# Patient Record
Sex: Male | Born: 1995
Health system: Southern US, Community
[De-identification: ages and names within clinical notes are randomized; demographics above are authoritative.]

## PROBLEM LIST (undated history)

## (undated) DIAGNOSIS — J45909 Unspecified asthma, uncomplicated: Secondary | ICD-10-CM

---
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
## 2015-07-04 ENCOUNTER — Encounter (HOSPITAL_BASED_OUTPATIENT_CLINIC_OR_DEPARTMENT_OTHER): Payer: Self-pay | Admitting: *Deleted

## 2015-07-04 ENCOUNTER — Emergency Department (HOSPITAL_BASED_OUTPATIENT_CLINIC_OR_DEPARTMENT_OTHER)
Admission: EM | Admit: 2015-07-04 | Discharge: 2015-07-04 | Disposition: A | Discharge status: Home / Self Care | Payer: No Typology Code available for payment source | Attending: Emergency Medicine | Admitting: Emergency Medicine

## 2015-07-04 DIAGNOSIS — F17210 Nicotine dependence, cigarettes, uncomplicated: Secondary | ICD-10-CM | POA: Insufficient documentation

## 2015-07-04 DIAGNOSIS — B9789 Other viral agents as the cause of diseases classified elsewhere: Secondary | ICD-10-CM

## 2015-07-04 DIAGNOSIS — J45909 Unspecified asthma, uncomplicated: Secondary | ICD-10-CM | POA: Insufficient documentation

## 2015-07-04 DIAGNOSIS — J069 Acute upper respiratory infection, unspecified: Secondary | ICD-10-CM | POA: Insufficient documentation

## 2015-07-04 DIAGNOSIS — M791 Myalgia: Secondary | ICD-10-CM | POA: Insufficient documentation

## 2015-07-04 MED ORDER — ACETAMINOPHEN 325 MG PO TABS
650.0000 mg | ORAL_TABLET | Freq: Once | ORAL | Status: AC | PRN
  Administered 2015-07-04: 650 mg via ORAL
  Filled 2015-07-04: qty 2

## 2015-07-04 MED ORDER — IBUPROFEN 800 MG PO TABS
800.0000 mg | ORAL_TABLET | Freq: Once | ORAL | Status: AC
  Administered 2015-07-04: 800 mg via ORAL
  Filled 2015-07-04: qty 1

## 2015-07-04 NOTE — Discharge Instructions (Signed)
Upper Respiratory Infection, Adult Most upper respiratory infections (URIs) are a viral infection of the air passages leading to the lungs. A URI affects the nose, throat, and upper air passages. The most common type of URI is nasopharyngitis and is typically referred to as "the common cold." URIs run their course and usually go away on their own. Most of the time, a URI does not require medical attention, but sometimes a bacterial infection in the upper airways can follow a viral infection. This is called a secondary infection. Sinus and middle ear infections are common types of secondary upper respiratory infections. Bacterial pneumonia can also complicate a URI. A URI can worsen asthma and chronic obstructive pulmonary disease (COPD). Sometimes, these complications can require emergency medical care and may be life threatening.  CAUSES Almost all URIs are caused by viruses. A virus is a type of germ and can spread from one person to another.  RISKS FACTORS You may be at risk for a URI if:   You smoke.   You have chronic heart or lung disease.  You have a weakened defense (immune) system.   You are very young or very old.   You have nasal allergies or asthma.  You work in crowded or poorly ventilated areas.  You work in health care facilities or schools. SIGNS AND SYMPTOMS  Symptoms typically develop 2-3 days after you come in contact with a cold virus. Most viral URIs last 7-10 days. However, viral URIs from the influenza virus (flu virus) can last 14-18 days and are typically more severe. Symptoms may include:   Runny or stuffy (congested) nose.   Sneezing.   Cough.   Sore throat.   Headache.   Fatigue.   Fever.   Loss of appetite.   Pain in your forehead, behind your eyes, and over your cheekbones (sinus pain).  Muscle aches.  DIAGNOSIS  Your health care provider may diagnose a URI by:  Physical exam.  Tests to check that your symptoms are not due to  another condition such as:  Strep throat.  Sinusitis.  Pneumonia.  Asthma. TREATMENT  A URI goes away on its own with time. It cannot be cured with medicines, but medicines may be prescribed or recommended to relieve symptoms. Medicines may help:  Reduce your fever.  Reduce your cough.  Relieve nasal congestion. HOME CARE INSTRUCTIONS   Take medicines only as directed by your health care provider.   Gargle warm saltwater or take cough drops to comfort your throat as directed by your health care provider.  Use a warm mist humidifier or inhale steam from a shower to increase air moisture. This may make it easier to breathe.  Drink enough fluid to keep your urine clear or pale yellow.   Eat soups and other clear broths and maintain good nutrition.   Rest as needed.   Return to work when your temperature has returned to normal or as your health care provider advises. You may need to stay home longer to avoid infecting others. You can also use a face mask and careful hand washing to prevent spread of the virus.  Increase the usage of your inhaler if you have asthma.   Do not use any tobacco products, including cigarettes, chewing tobacco, or electronic cigarettes. If you need help quitting, ask your health care provider. PREVENTION  The best way to protect yourself from getting a cold is to practice good hygiene.   Avoid oral or hand contact with people with cold   symptoms.   Wash your hands often if contact occurs.  There is no clear evidence that vitamin C, vitamin E, echinacea, or exercise reduces the chance of developing a cold. However, it is always recommended to get plenty of rest, exercise, and practice good nutrition.  SEEK MEDICAL CARE IF:   You are getting worse rather than better.   Your symptoms are not controlled by medicine.   You have chills.  You have worsening shortness of breath.  You have brown or red mucus.  You have yellow or brown nasal  discharge.  You have pain in your face, especially when you bend forward.  You have a fever.  You have swollen neck glands.  You have pain while swallowing.  You have white areas in the back of your throat. SEEK IMMEDIATE MEDICAL CARE IF:   You have severe or persistent:  Headache.  Ear pain.  Sinus pain.  Chest pain.  You have chronic lung disease and any of the following:  Wheezing.  Prolonged cough.  Coughing up blood.  A change in your usual mucus.  You have a stiff neck.  You have changes in your:  Vision.  Hearing.  Thinking.  Mood. MAKE SURE YOU:   Understand these instructions.  Will watch your condition.  Will get help right away if you are not doing well or get worse.   This information is not intended to replace advice given to you by your health care provider. Make sure you discuss any questions you have with your health care provider.   Document Released: 10/25/2000 Document Revised: 09/15/2014 Document Reviewed: 08/06/2013 Elsevier Interactive Patient Education 2016 [employer]  

## 2015-07-04 NOTE — ED Provider Notes (Signed)
CSN: [id number]     Arrival date & time 07/04/15  0865 History   First MD Initiated Contact with Patient 07/04/15 [phone]     Chief Complaint  Patient presents with  . URI     (Consider location/radiation/quality/duration/timing/severity/associated sxs/prior Treatment) Patient is a 20 y.o. male presenting with URI. The history is provided by the patient.  URI Presenting symptoms: cough and fever   Severity:  Moderate Onset quality:  Gradual Duration:  2 days Timing:  Constant Progression:  Unchanged Chronicity:  New Relieved by:  Nothing Worsened by:  Nothing tried Ineffective treatments: theraflu. Associated symptoms: myalgias     Past Medical History  Diagnosis Date  . Asthma    History reviewed. No pertinent past surgical history. No family history on file. Social History  Substance Use Topics  . Smoking status: Current Every Day Smoker -- 0.50 packs/day    Types: Cigarettes  . Smokeless tobacco: None  . Alcohol Use: Yes    Review of Systems  Constitutional: Positive for fever.  Respiratory: Positive for cough.   Musculoskeletal: Positive for myalgias.  All other systems reviewed and are negative.     Allergies  Review of patient's allergies indicates no known allergies.  Home Medications   Prior to Admission medications   Not on File   BP 132/76 mmHg  Pulse 78  Temp(Src) 101.3 F (38.5 C) (Oral)  Resp 18  Ht  (1.905 m)  Wt 195 lb (88.451 kg)  BMI 24.37 kg/m2  SpO2 98% Physical Exam  Constitutional: He is oriented to person, place, and time. He appears well-developed and well-nourished. No distress.  HENT:  Head: Normocephalic and atraumatic.  Eyes: Conjunctivae are normal.  Neck: Neck supple. No tracheal deviation present.  Cardiovascular: Normal rate, regular rhythm and normal heart sounds.   Pulmonary/Chest: Effort normal and breath sounds normal. No respiratory distress. He has no wheezes. He has no rales.  Abdominal: Soft. He exhibits  no distension.  Neurological: He is alert and oriented to person, place, and time.  Skin: Skin is warm and dry.  Psychiatric: He has a normal mood and affect.  Nursing note and vitals reviewed.   ED Course  Procedures (including critical care time) Labs Review Labs Reviewed - No data to display  Imaging Review No results found. I have personally reviewed and evaluated these images and lab results as part of my medical decision-making.   EKG Interpretation None      MDM   Final diagnoses:  Viral URI with cough    20 y.o. male presents with cough and fever with chills for 2 days. Asking for work note. No signs of respiratory distress, non-toxic appearing, CTAB, no concern for pneumonia with this clinical picture. No emergent testing indicated at this time. Pt discharged with likely viral cough which will be self limited in its course. Advised on optimal use of motrin and tylenol for fever or symptomatic control. Patient needs to establish primary care in the area and was provided contact information to do so.     Lyndal Pulley, MD 07/04/15 1125

## 2015-07-04 NOTE — ED Notes (Signed)
Patient c/o fever;/cough/nausea/body aches/ha

## 2015-07-04 NOTE — ED Notes (Signed)
Patent not being discharged at this time

## 2015-12-27 ENCOUNTER — Emergency Department (HOSPITAL_BASED_OUTPATIENT_CLINIC_OR_DEPARTMENT_OTHER): Payer: Self-pay

## 2015-12-27 ENCOUNTER — Encounter (HOSPITAL_BASED_OUTPATIENT_CLINIC_OR_DEPARTMENT_OTHER): Payer: Self-pay | Admitting: *Deleted

## 2015-12-27 ENCOUNTER — Emergency Department (HOSPITAL_BASED_OUTPATIENT_CLINIC_OR_DEPARTMENT_OTHER)
Admission: EM | Admit: 2015-12-27 | Discharge: 2015-12-27 | Disposition: A | Discharge status: Home / Self Care | Payer: Self-pay | Attending: Emergency Medicine | Admitting: Emergency Medicine

## 2015-12-27 DIAGNOSIS — J45909 Unspecified asthma, uncomplicated: Secondary | ICD-10-CM | POA: Insufficient documentation

## 2015-12-27 DIAGNOSIS — N50819 Testicular pain, unspecified: Secondary | ICD-10-CM

## 2015-12-27 DIAGNOSIS — F17210 Nicotine dependence, cigarettes, uncomplicated: Secondary | ICD-10-CM | POA: Insufficient documentation

## 2015-12-27 DIAGNOSIS — N451 Epididymitis: Secondary | ICD-10-CM | POA: Insufficient documentation

## 2015-12-27 DIAGNOSIS — N433 Hydrocele, unspecified: Secondary | ICD-10-CM | POA: Insufficient documentation

## 2015-12-27 LAB — URINE MICROSCOPIC-ADD ON

## 2015-12-27 LAB — URINALYSIS, ROUTINE W REFLEX MICROSCOPIC
Bilirubin Urine: NEGATIVE
GLUCOSE, UA: NEGATIVE mg/dL
Hgb urine dipstick: NEGATIVE
Ketones, ur: NEGATIVE mg/dL
Nitrite: NEGATIVE
PH: 5.5 (ref 5.0–8.0)
Protein, ur: NEGATIVE mg/dL
SPECIFIC GRAVITY, URINE: 1.01 (ref 1.005–1.030)

## 2015-12-27 MED ORDER — LIDOCAINE HCL (PF) 1 % IJ SOLN
INTRAMUSCULAR | Status: AC
  Administered 2015-12-27: 0.9 mL
  Filled 2015-12-27: qty 5

## 2015-12-27 MED ORDER — DOXYCYCLINE HYCLATE 100 MG PO CAPS: 100.0000 mg | ORAL_CAPSULE | Freq: Two times a day (BID) | ORAL | 0 refills | Status: DC

## 2015-12-27 MED ORDER — DOXYCYCLINE HYCLATE 100 MG PO TABS
100.0000 mg | ORAL_TABLET | Freq: Once | ORAL | Status: AC
  Administered 2015-12-27: 100 mg via ORAL
  Filled 2015-12-27: qty 1

## 2015-12-27 MED ORDER — CEFTRIAXONE SODIUM 250 MG IJ SOLR
250.0000 mg | Freq: Once | INTRAMUSCULAR | Status: AC
  Administered 2015-12-27: 250 mg via INTRAMUSCULAR
  Filled 2015-12-27: qty 250

## 2015-12-27 MED FILL — DOXYCYCLINE HYC 100 MG CAP: 100 | 10 days supply | Qty: 20 | Fill #0

## 2015-12-27 NOTE — ED Provider Notes (Signed)
[facility]   CSN: [id number] Arrival date & time: 12/27/15  1222     History   Chief Complaint Chief Complaint  Patient presents with  . Testicle Pain    HPI Ryan Armstrong is a 20 y.o. male.  HPI Ryan Armstrong is a 20 y.o. male presents to ED with complaint of right testicle pain and swelling onset 2 days ago. Denies trauma. States pain with movement and palpation. Denies penile discharge. No rashes. No fever or chills. No other complaints. No hx of the same.   Past Medical History:  Diagnosis Date  . Asthma     There are no active problems to display for this patient.   History reviewed. No pertinent surgical history.     Home Medications    Prior to Admission medications   Not on File    Family History No family history on file.  Social History Social History  Substance Use Topics  . Smoking status: Current Every Day Smoker    Packs/day: 0.50    Types: Cigarettes  . Smokeless tobacco: Never Used  . Alcohol use Yes     Allergies   Review of patient's allergies indicates no known allergies.   Review of Systems Review of Systems  Constitutional: Negative for chills and fever.  Respiratory: Negative for cough, chest tightness and shortness of breath.   Cardiovascular: Negative for chest pain, palpitations and leg swelling.  Gastrointestinal: Negative for abdominal distention, abdominal pain, diarrhea, nausea and vomiting.  Genitourinary: Positive for scrotal swelling and testicular pain. Negative for discharge, dysuria, frequency, hematuria, penile pain, penile swelling and urgency.  Skin: Negative for rash.  Allergic/Immunologic: Negative for immunocompromised state.  Neurological: Negative for dizziness, weakness, light-headedness, numbness and headaches.  All other systems reviewed and are negative.    Physical Exam Updated Vital Signs BP 127/75 (BP Location: Right Arm)   Pulse (!) 51   Temp 98.2 F (36.8 C)  (Oral)   Resp 18   Ht 6\' 3"  (1.905 m)   Wt 88.5 kg   SpO2 100%   BMI 24.37 kg/m   Physical Exam  Constitutional: He appears well-developed and well-nourished. No distress.  HENT:  Head: Normocephalic and atraumatic.  Eyes: Conjunctivae are normal.  Neck: Neck supple.  Cardiovascular: Normal rate, regular rhythm and normal heart sounds.   Pulmonary/Chest: Effort normal. No respiratory distress. He has no wheezes. He has no rales.  Abdominal: Soft. Bowel sounds are normal. He exhibits no distension. There is no tenderness. There is no rebound.  Genitourinary:  Genitourinary Comments: Normal left testicle. Right testicle is enlarged and tender. Normal cremasteric reflex. Normal penis.  Musculoskeletal: He exhibits no edema.  Neurological: He is alert.  Skin: Skin is warm and dry.  Nursing note and vitals reviewed.    ED Treatments / Results  Labs (all labs ordered are listed, but only abnormal results are displayed) Labs Reviewed  URINALYSIS, ROUTINE W REFLEX MICROSCOPIC (NOT AT [facility]) - Abnormal; Notable for the following:       Result Value   Leukocytes, UA MODERATE (*)    All other components within normal limits  URINE MICROSCOPIC-ADD ON - Abnormal; Notable for the following:    Squamous Epithelial / LPF 0-5 (*)    Bacteria, UA RARE (*)    All other components within normal limits  URINE CULTURE  GC/CHLAMYDIA PROBE AMP ([place]) NOT AT [facility]    EKG  EKG Interpretation None  Radiology [place] Scrotum  Result Date: 12/27/2015 CLINICAL DATA:  Right scrotal swelling and pain for 2 days. EXAM: SCROTAL ULTRASOUND DOPPLER ULTRASOUND OF THE TESTICLES TECHNIQUE: Complete ultrasound examination of the testicles, epididymis, and other scrotal structures was performed. Color and spectral Doppler ultrasound were also utilized to evaluate blood flow to the testicles. COMPARISON:  None. FINDINGS: Right testicle Measurements: 4.0 x 2.3 x 3.3 cm. No mass or microlithiasis  visualized. Left testicle Measurements: 4.4 x 2.5 x 2.9 cm. No mass or microlithiasis visualized. Right epididymis: Swelling and and increased blood flow on color Doppler ultrasound is seen involving the tail of the right epididymis along the inferior aspect of the right testicle. This is consistent with epididymitis. Left epididymis:  Normal in size and appearance. Hydrocele:  Small right-sided hydrocele noted. Varicocele: Small to moderate varicoceles are seen bilaterally, right side larger than left. Pulsed Doppler interrogation of both testes demonstrates normal low resistance arterial and venous waveforms bilaterally. IMPRESSION: No evidence of testicular mass or torsion. Right-sided epididymitis involving the epididymal tail. Small right hydrocele also noted. Small to moderate bilateral varicoceles. Electronically Signed   By: Myles RosenthalJohn  Stahl M.D.   On: 12/27/2015 14:44   [place] Art/ven Flow Abd Pelv Doppler  Result Date: 12/27/2015 CLINICAL DATA:  Right scrotal swelling and pain for 2 days. EXAM: SCROTAL ULTRASOUND DOPPLER ULTRASOUND OF THE TESTICLES TECHNIQUE: Complete ultrasound examination of the testicles, epididymis, and other scrotal structures was performed. Color and spectral Doppler ultrasound were also utilized to evaluate blood flow to the testicles. COMPARISON:  None. FINDINGS: Right testicle Measurements: 4.0 x 2.3 x 3.3 cm. No mass or microlithiasis visualized. Left testicle Measurements: 4.4 x 2.5 x 2.9 cm. No mass or microlithiasis visualized. Right epididymis: Swelling and and increased blood flow on color Doppler ultrasound is seen involving the tail of the right epididymis along the inferior aspect of the right testicle. This is consistent with epididymitis. Left epididymis:  Normal in size and appearance. Hydrocele:  Small right-sided hydrocele noted. Varicocele: Small to moderate varicoceles are seen bilaterally, right side larger than left. Pulsed Doppler interrogation of both testes  demonstrates normal low resistance arterial and venous waveforms bilaterally. IMPRESSION: No evidence of testicular mass or torsion. Right-sided epididymitis involving the epididymal tail. Small right hydrocele also noted. Small to moderate bilateral varicoceles. Electronically Signed   By: Myles RosenthalJohn  Stahl M.D.   On: 12/27/2015 14:44    Procedures Procedures (including critical care time)  Medications Ordered in ED Medications  cefTRIAXone (ROCEPHIN) injection 250 mg (250 mg Intramuscular Given 12/27/15 1509)  doxycycline (VIBRA-TABS) tablet 100 mg (100 mg Oral Given 12/27/15 1509)  lidocaine (PF) (XYLOCAINE) 1 % injection (0.9 mLs  Given 12/27/15 1508)     Initial Impression / Assessment and Plan / ED Course  I have reviewed the triage vital signs and the nursing notes.  Pertinent labs & imaging results that were available during my care of the patient were reviewed by me and considered in my medical decision making (see chart for details).  Clinical Course    Patient emergency department with right sided scrotal swelling and pain for 2 days. Urinalysis shows rare bacteria and 6-30 WBCs. Ultrasound obtained which showed a right-sided epididymitis and small right hydrocele. Given patient's age group, will treat with Rocephin 250 mg IM and doxycycline for 10 days. Instructed to follow-up. Return precautions discussed.    Final Clinical Impressions(s) / ED Diagnoses   Final diagnoses:  Testicle pain     New Prescriptions New Prescriptions  No medications on file     Jaynie Crumbleatyana Tyese Finken, PA-C 12/27/15 1535    Jerelyn ScottMartha Linker, MD 12/28/15 [phone]

## 2015-12-27 NOTE — Discharge Instructions (Signed)
Take antibiotics as prescribed until all gone. Keep scrotum elevated when sitting/laying down. Follow up with urology if not improving.

## 2015-12-27 NOTE — ED Triage Notes (Signed)
Knot on his left testicle. Pain 3 days. Swelling.

## 2015-12-28 LAB — URINE CULTURE: Culture: NO GROWTH

## 2015-12-28 LAB — GC/CHLAMYDIA PROBE AMP (~~LOC~~) NOT AT ARMC
CHLAMYDIA, DNA PROBE: POSITIVE — AB
Neisseria Gonorrhea: NEGATIVE

## 2015-12-29 ENCOUNTER — Telehealth (HOSPITAL_BASED_OUTPATIENT_CLINIC_OR_DEPARTMENT_OTHER): Payer: Self-pay

## 2015-12-29 ENCOUNTER — Telehealth: Payer: Self-pay | Admitting: *Deleted

## 2015-12-29 NOTE — Telephone Encounter (Signed)
Post ED Visit - Positive Culture Follow-up: Unsuccessful Patient Follow-up  Culture assessed and recommendations reviewed by: []  Enzo BiNathan Batchelder, Pharm.D. []  Celedonio MiyamotoJeremy Frens, Pharm.D., BCPS []  Garvin FilaMike Maccia, Pharm.D. []  Georgina PillionElizabeth Martin, [address]Ph[address]D., BCPS []  Country AcresMinh Pham, [place]arm[place]D., BCPS, AAHIVP []  Estella HuskMichelle Turner, Pharm.D., BCPS, AAHIVP []  Cassie Stewart, Pharm.D. []  Rob Oswaldo DoneVincent, [address]Pharm[address]  Positive chlamydia culture/treated at time of visit  []  Patient discharged without antimicrobial prescription and treatment is now indicated []  Organism is resistant to prescribed ED discharge antimicrobial []  Patient with positive blood cultures   Unable to contact patient after 3 attempts, letter will be sent to address on file  Ryan PearlRobertson, Ryan Armstrong 12/29/2015, 8:43 AM

## 2016-03-01 ENCOUNTER — Telehealth (HOSPITAL_BASED_OUTPATIENT_CLINIC_OR_DEPARTMENT_OTHER): Payer: Self-pay | Admitting: Emergency Medicine

## 2016-03-01 NOTE — Telephone Encounter (Signed)
Lost to followup 

## 2016-12-23 IMAGING — US US SCROTUM
1 series · 13 of 25 positions shown · non-contrast
Comparison: None.

CLINICAL DATA: Right scrotal swelling and pain for 2 days.

EXAM:
SCROTAL ULTRASOUND
DOPPLER ULTRASOUND OF THE TESTICLES
TECHNIQUE: Complete ultrasound examination of the testicles, epididymis, and
other scrotal structures was performed. Color and spectral Doppler
ultrasound were also utilized to evaluate blood flow to the
testicles.

[Series 1: us scrotum · 0.08mm/px · 13 of 47 slices shown]
[im 1/47]
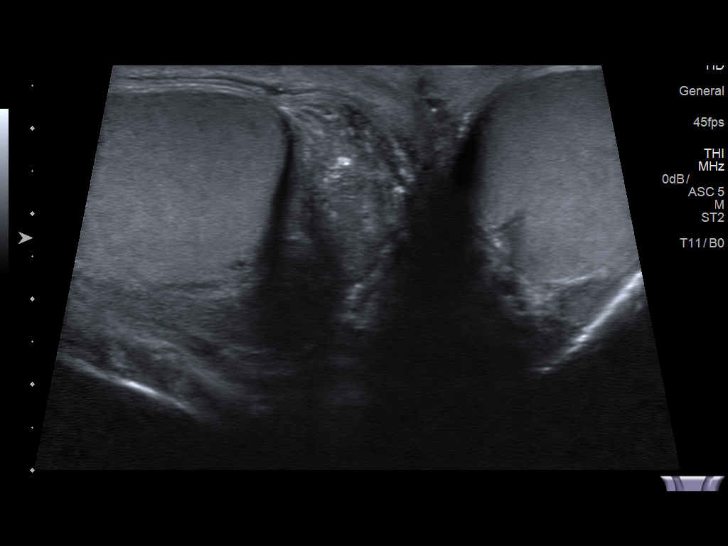
[im 4/47]
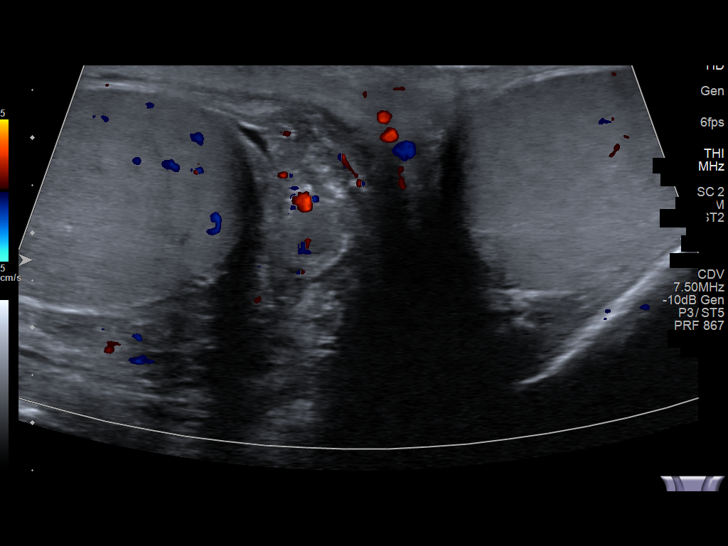
[im 8/47]
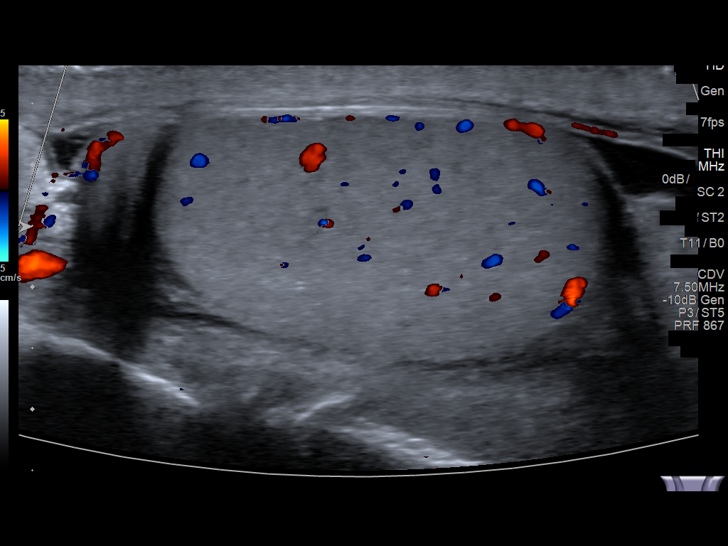
[im 12/47]
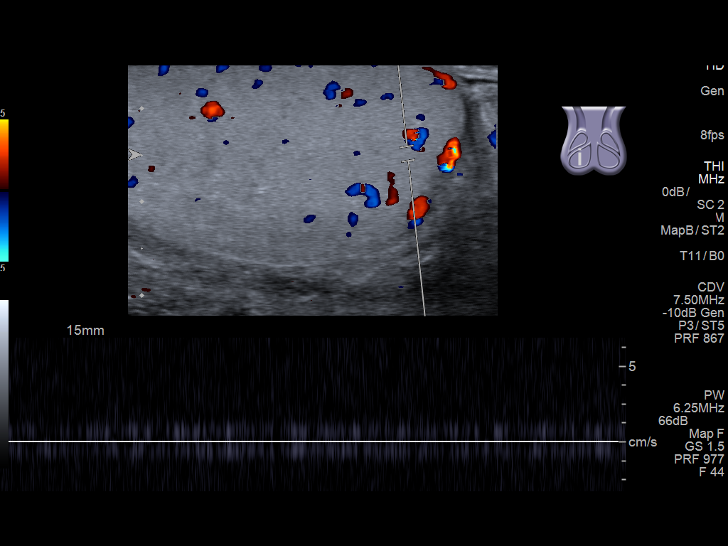
[im 16/47]
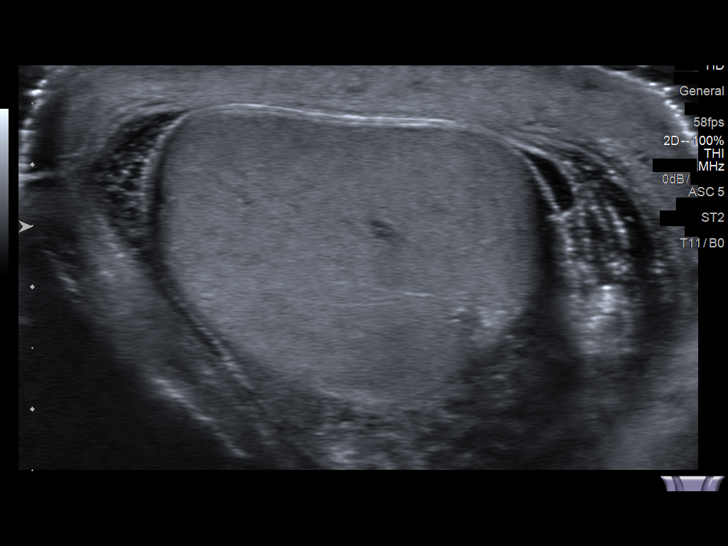
[im 20/47]
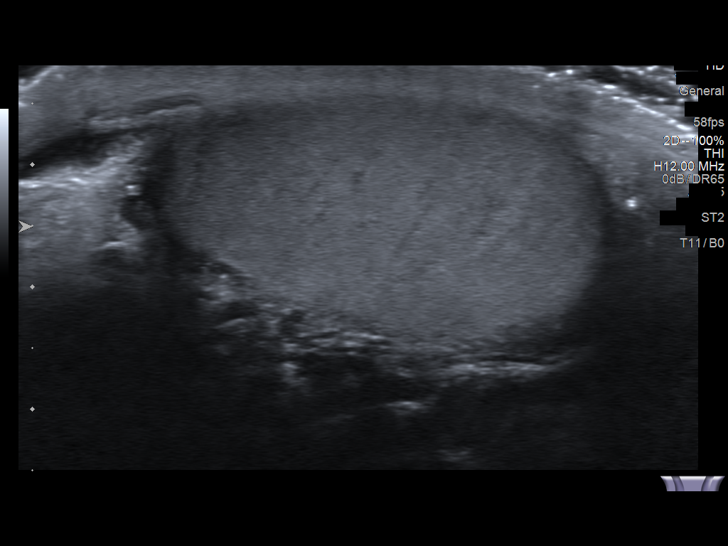
[im 24/47]
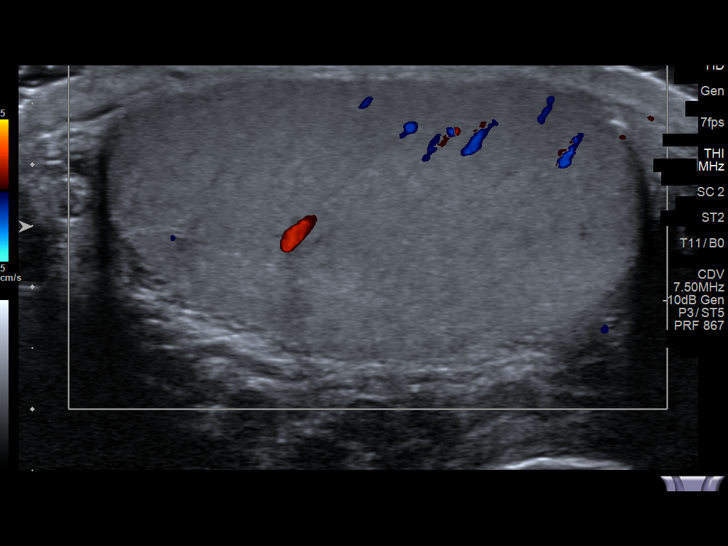
[im 27/47]
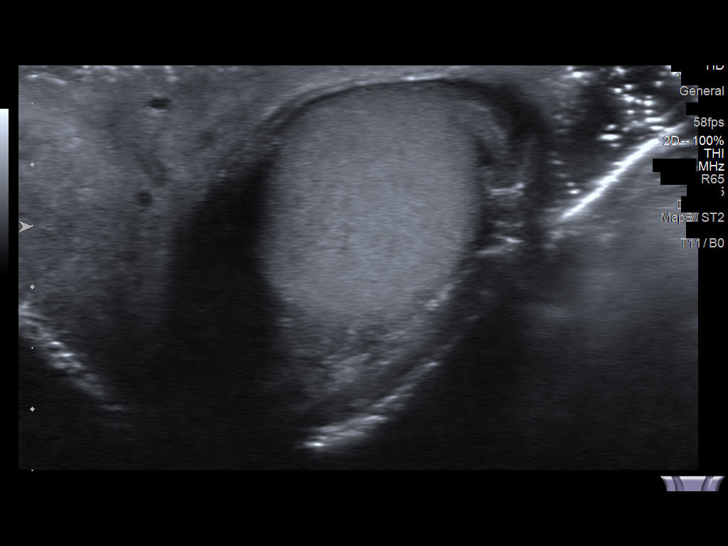
[im 31/47]
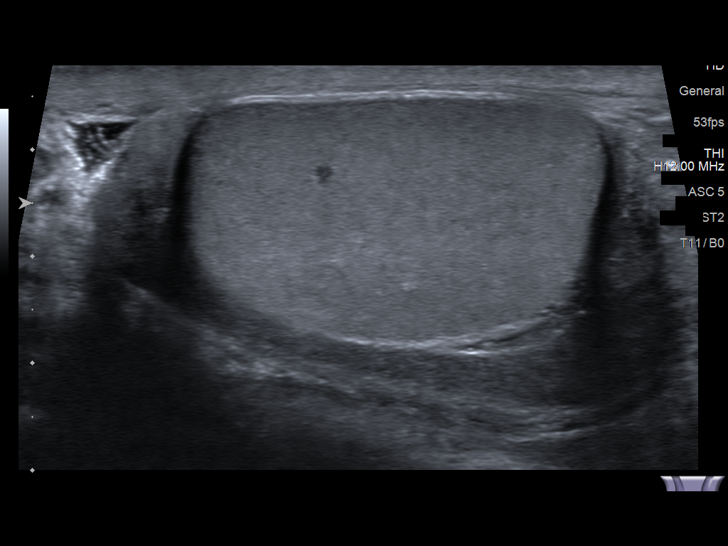
[im 35/47]
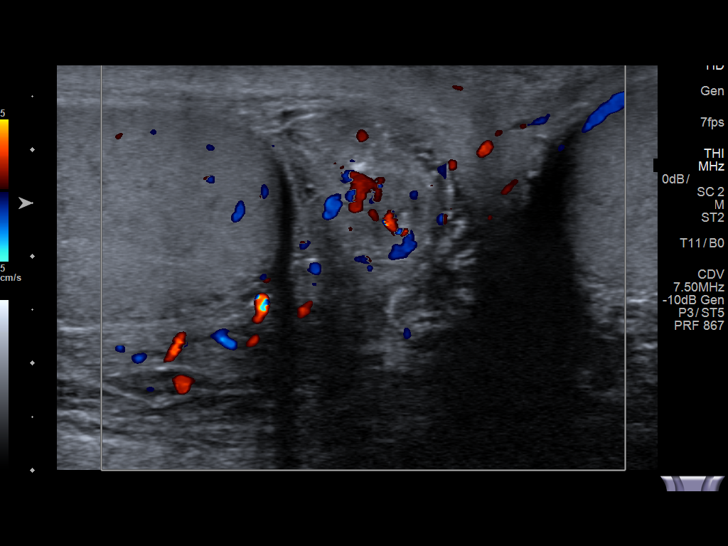
[im 39/47]
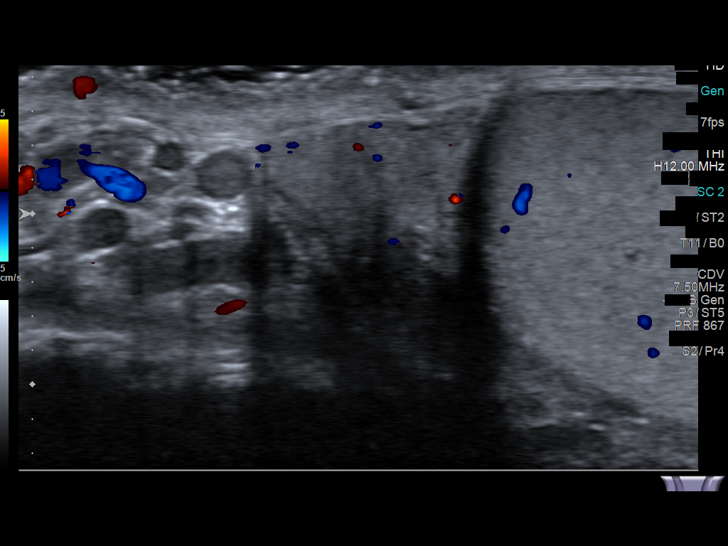
[im 43/47]
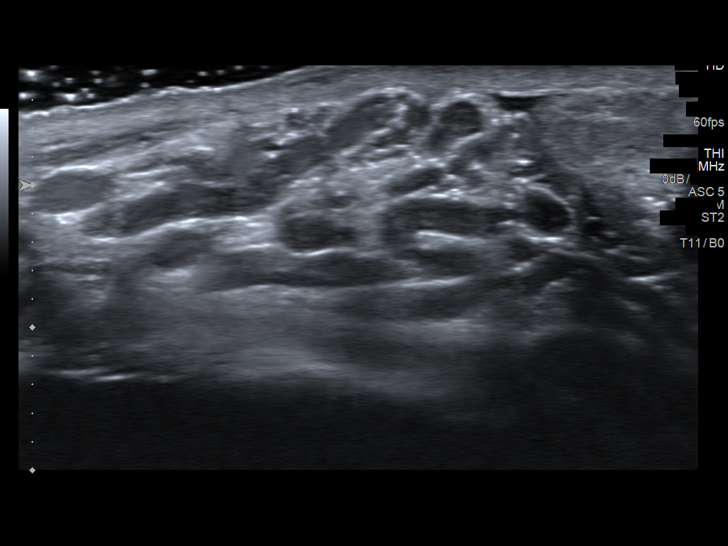
[im 47/47]
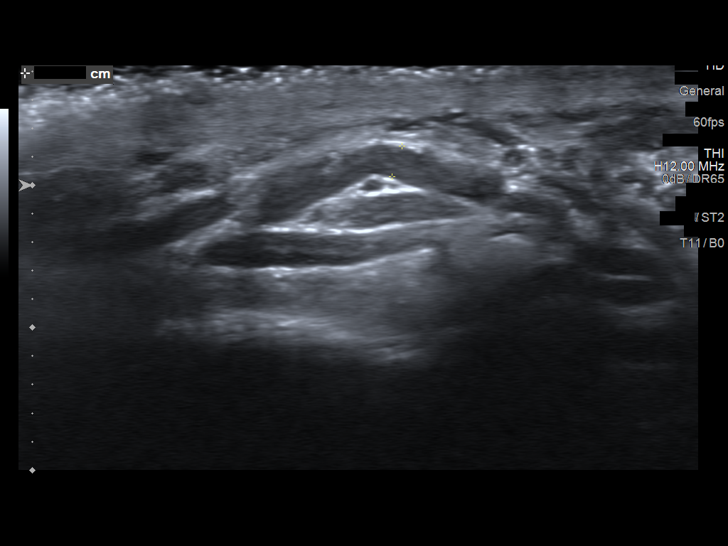

[13 of 25 positions shown; findings below may reference images not displayed]

FINDINGS: Right testicle

Measurements: 4.0 x 2.3 x 3.3 cm. No mass or microlithiasis
visualized.

Left testicle

Measurements: 4.4 x 2.5 x 2.9 cm. No mass or microlithiasis
visualized.

Right epididymis: Swelling and and increased blood flow on color
Doppler ultrasound is seen involving the tail of the right
epididymis along the inferior aspect of the right testicle. This is
consistent with epididymitis.

Left epididymis:  Normal in size and appearance.

Hydrocele:  Small right-sided hydrocele noted.

Varicocele: Small to moderate varicoceles are seen bilaterally,
right side larger than left.

Pulsed Doppler interrogation of both testes demonstrates normal low
resistance arterial and venous waveforms bilaterally.
IMPRESSION: No evidence of testicular mass or torsion.

Right-sided epididymitis involving the epididymal tail. Small right
hydrocele also noted.

Small to moderate bilateral varicoceles.

## 2017-06-15 ENCOUNTER — Encounter (HOSPITAL_BASED_OUTPATIENT_CLINIC_OR_DEPARTMENT_OTHER): Payer: Self-pay

## 2017-06-15 ENCOUNTER — Other Ambulatory Visit: Payer: Self-pay

## 2017-06-15 ENCOUNTER — Emergency Department (HOSPITAL_BASED_OUTPATIENT_CLINIC_OR_DEPARTMENT_OTHER)
Admission: EM | Admit: 2017-06-15 | Discharge: 2017-06-16 | Disposition: A | Discharge status: Home / Self Care | Payer: Self-pay | Attending: Emergency Medicine | Admitting: Emergency Medicine

## 2017-06-15 DIAGNOSIS — J45901 Unspecified asthma with (acute) exacerbation: Secondary | ICD-10-CM | POA: Insufficient documentation

## 2017-06-15 DIAGNOSIS — F17210 Nicotine dependence, cigarettes, uncomplicated: Secondary | ICD-10-CM | POA: Insufficient documentation

## 2017-06-15 DIAGNOSIS — J209 Acute bronchitis, unspecified: Secondary | ICD-10-CM | POA: Insufficient documentation

## 2017-06-15 MED ORDER — IPRATROPIUM-ALBUTEROL 0.5-2.5 (3) MG/3ML IN SOLN
3.0000 mL | Freq: Four times a day (QID) | RESPIRATORY_TRACT | Status: DC
  Administered 2017-06-15: 3 mL via RESPIRATORY_TRACT
  Filled 2017-06-15: qty 3

## 2017-06-15 MED ORDER — ALBUTEROL SULFATE (2.5 MG/3ML) 0.083% IN NEBU
2.5000 mg | INHALATION_SOLUTION | Freq: Once | RESPIRATORY_TRACT | Status: AC
  Administered 2017-06-15: 2.5 mg via RESPIRATORY_TRACT
  Filled 2017-06-15: qty 3

## 2017-06-15 NOTE — ED Triage Notes (Signed)
Pt c/o SOB and wheezing with nasal congestion for the last two weeks, hx of asthma and doesn't have an inhaler, denies any OTC meds for symptoms

## 2017-06-16 MED ORDER — ALBUTEROL SULFATE HFA 108 (90 BASE) MCG/ACT IN AERS
2.0000 | INHALATION_SPRAY | RESPIRATORY_TRACT | Status: DC | PRN
Start: 2017-06-16 — End: 2017-06-16
  Administered 2017-06-16: 2 via RESPIRATORY_TRACT
  Filled 2017-06-16: qty 6.7

## 2017-06-16 MED ORDER — DEXAMETHASONE SODIUM PHOSPHATE 10 MG/ML IJ SOLN
10.0000 mg | Freq: Once | INTRAMUSCULAR | Status: AC
  Administered 2017-06-16: 10 mg via INTRAMUSCULAR
  Filled 2017-06-16: qty 1

## 2017-06-16 NOTE — ED Notes (Signed)
C/o sob x 2 weeks  Has not had inhaler  Denies pain    Received breathing tx on arrival,  States he feels a lot better now

## 2017-06-16 NOTE — ED Provider Notes (Signed)
   [facility] Provider Note: Lowella DellJ. Lane Linetta Regner, MD, FACEP  CSN: [id number] MRN: [id number] ARRIVAL: 06/15/17 at 2322 ROOM: [facility]07/[facility]07   CHIEF COMPLAINT  Shortness of Breath   HISTORY OF PRESENT ILLNESS  06/16/17 2:02 AM Ryan Armstrong is a [age] y.o. male with a history of asthma.  He is here with a 1-2-week history of shortness of breath and wheezing.  This worsened acutely and was moderate to severe on arrival.  He does not have a home inhaler.  He was noted to be wheezing and was given albuterol and Atrovent by respiratory therapy with significant improvement.  He denies nasal congestion or rhinorrhea to me.  He has had an associated cough.  He was noted to have a low-grade fever on arrival.   Past Medical History:  Diagnosis Date  . Asthma     History reviewed. No pertinent surgical history.  No family history on file.  Social History   Tobacco Use  . Smoking status: Current Every Day Smoker    Packs/day: 0.50    Types: Cigarettes  . Smokeless tobacco: Never Used  Substance Use Topics  . Alcohol use: Yes  . Drug use: No    Prior to Admission medications   Medication Sig Start Date End Date Taking? Authorizing Provider  doxycycline (VIBRAMYCIN) 100 MG capsule Take 1 capsule (100 mg total) by mouth 2 (two) times daily. 12/27/15   Jaynie CrumbleKirichenko, Tatyana, PA-C    Allergies Patient has no known allergies.   REVIEW OF SYSTEMS  Negative except as noted here or in the History of Present Illness.   PHYSICAL EXAMINATION  Initial Vital Signs Blood pressure 118/66, pulse 84, temperature 99.2 F (37.3 C), temperature source Oral, resp. rate 18, height 6\' 3"  (1.905 m), weight 90.7 kg (200 lb), SpO2 92 %.  Examination General: Well-developed, well-nourished male in no acute distress; appearance consistent with age of record HENT: normocephalic; atraumatic; no pharyngeal erythema or exudate Eyes: pupils equal, round and reactive to light; extraocular muscles intact Neck:  supple Heart: regular rate and rhythm Lungs: clear to auscultation bilaterally Abdomen: soft; nondistended; nontender; bowel sounds present Extremities: No deformity; full range of motion; pulses normal Neurologic: Awake, alert and oriented; motor function intact in all extremities and symmetric; no facial droop Skin: Warm and dry Psychiatric: Normal mood and affect   RESULTS  Summary of this visit's results, reviewed by myself:   EKG Interpretation  Date/Time:    Ventricular Rate:    PR Interval:    QRS Duration:   QT Interval:    QTC Calculation:   R Axis:     Text Interpretation:        Laboratory Studies: No results found for this or any previous visit (from the past 24 hour(s)). Imaging Studies: No results found.  ED COURSE  Nursing notes and initial vitals signs, including pulse oximetry, reviewed.  Vitals:   06/15/17 2331  BP: 118/66  Pulse: 84  Resp: 18  Temp: 99.2 F (37.3 C)  TempSrc: Oral  SpO2: 92%  Weight: 90.7 kg (200 lb)  Height: 6\' 3"  (1.905 m)   Will provide patient with a new inhaler and give a dose of dexamethasone prior to discharge.  This likely represents an acute exacerbation of asthma possibly due to viral or seasonal bronchitis.  PROCEDURES    ED DIAGNOSES     ICD-10-CM   1. Acute bronchitis with bronchospasm J20.9        Vasco Chong, MD 06/16/17 [phone]

## 2017-09-26 ENCOUNTER — Emergency Department (HOSPITAL_BASED_OUTPATIENT_CLINIC_OR_DEPARTMENT_OTHER)
Admission: EM | Admit: 2017-09-26 | Discharge: 2017-09-26 | Disposition: A | Discharge status: Home / Self Care | Payer: Self-pay | Attending: Emergency Medicine | Admitting: Emergency Medicine

## 2017-09-26 ENCOUNTER — Encounter (HOSPITAL_BASED_OUTPATIENT_CLINIC_OR_DEPARTMENT_OTHER): Payer: Self-pay

## 2017-09-26 ENCOUNTER — Other Ambulatory Visit: Payer: Self-pay

## 2017-09-26 DIAGNOSIS — F17210 Nicotine dependence, cigarettes, uncomplicated: Secondary | ICD-10-CM | POA: Insufficient documentation

## 2017-09-26 DIAGNOSIS — J4521 Mild intermittent asthma with (acute) exacerbation: Secondary | ICD-10-CM | POA: Insufficient documentation

## 2017-09-26 MED ORDER — IPRATROPIUM-ALBUTEROL 0.5-2.5 (3) MG/3ML IN SOLN
3.0000 mL | Freq: Once | RESPIRATORY_TRACT | Status: AC
  Administered 2017-09-26: 3 mL via RESPIRATORY_TRACT

## 2017-09-26 MED ORDER — ALBUTEROL SULFATE (2.5 MG/3ML) 0.083% IN NEBU
2.5000 mg | INHALATION_SOLUTION | Freq: Once | RESPIRATORY_TRACT | Status: AC
  Administered 2017-09-26: 2.5 mg via RESPIRATORY_TRACT

## 2017-09-26 MED ORDER — ALBUTEROL SULFATE HFA 108 (90 BASE) MCG/ACT IN AERS: 1.0000 | INHALATION_SPRAY | RESPIRATORY_TRACT | 1 refills | Status: AC | PRN

## 2017-09-26 MED ORDER — IPRATROPIUM-ALBUTEROL 0.5-2.5 (3) MG/3ML IN SOLN
RESPIRATORY_TRACT | Status: AC
  Administered 2017-09-26: 3 mL via RESPIRATORY_TRACT
  Filled 2017-09-26: qty 3

## 2017-09-26 MED ORDER — ALBUTEROL SULFATE (2.5 MG/3ML) 0.083% IN NEBU
INHALATION_SOLUTION | RESPIRATORY_TRACT | Status: AC
  Administered 2017-09-26: 2.5 mg via RESPIRATORY_TRACT
  Filled 2017-09-26: qty 3

## 2017-09-26 MED ORDER — DEXAMETHASONE 6 MG PO TABS
16.0000 mg | ORAL_TABLET | Freq: Once | ORAL | Status: AC
  Administered 2017-09-26: 16 mg via ORAL
  Filled 2017-09-26: qty 1

## 2017-09-26 NOTE — ED Provider Notes (Signed)
[facility] EMERGENCY DEPARTMENT Provider Note   CSN: [id number] Arrival date & time: 09/26/17  1140     History   Chief Complaint Chief Complaint  Patient presents with  . Wheezing    HPI Ryan Armstrong is a 22 y.o. male.  HPI  22 year old male with a history of asthma presents with recurrent dyspnea.  Started yesterday, seemed to improve but then came back again.  A little bit of intermittent dry cough.  Denies fever, chest pain, runny nose or sore throat.  Was given albuterol prior to me seeing him and now feels much better.  He states there is only slight dyspnea remaining.  No leg swelling.  He has run out of his inhaler.  Past Medical History:  Diagnosis Date  . Asthma     There are no active problems to display for this patient.   History reviewed. No pertinent surgical history.      Home Medications    Prior to Admission medications   Medication Sig Start Date End Date Taking? Authorizing Provider  albuterol (PROVENTIL HFA;VENTOLIN HFA) 108 (90 Base) MCG/ACT inhaler Inhale 1-2 puffs into the lungs every 4 (four) hours as needed for wheezing or shortness of breath. 09/26/17   Pricilla Loveless, MD    Family History No family history on file.  Social History Social History   Tobacco Use  . Smoking status: Current Every Day Smoker    Packs/day: 0.50    Types: Cigarettes  . Smokeless tobacco: Never Used  Substance Use Topics  . Alcohol use: Yes    Comment: occ  . Drug use: No    Types: Marijuana     Allergies   Patient has no known allergies.   Review of Systems Review of Systems  Constitutional: Negative for fever.  HENT: Negative for congestion and rhinorrhea.   Respiratory: Positive for cough and shortness of breath.   Cardiovascular: Negative for chest pain and leg swelling.  All other systems reviewed and are negative.    Physical Exam Updated Vital Signs BP 122/71 (BP Location: Left Arm)   Pulse 86   Temp 98.1 F (36.7 C)  (Oral)   Resp 14   Ht  (1.905 m)   Wt 91.6 kg (202 lb)   SpO2 100%   BMI 25.25 kg/m   Physical Exam  Constitutional: He is oriented to person, place, and time. He appears well-developed and well-nourished. No distress.  HENT:  Head: Normocephalic and atraumatic.  Right Ear: External ear normal.  Left Ear: External ear normal.  Nose: Nose normal.  Eyes: Right eye exhibits no discharge. Left eye exhibits no discharge.  Neck: Neck supple.  Cardiovascular: Normal rate, regular rhythm and normal heart sounds.  Pulmonary/Chest: Effort normal and breath sounds normal. No stridor. He has no wheezes.  Abdominal: He exhibits no distension.  Neurological: He is alert and oriented to person, place, and time.  Skin: Skin is warm and dry. He is not diaphoretic.  Nursing note and vitals reviewed.    ED Treatments / Results  Labs (all labs ordered are listed, but only abnormal results are displayed) Labs Reviewed - No data to display  EKG None  Radiology No results found.  Procedures Procedures (including critical care time)  Medications Ordered in ED Medications  dexamethasone (DECADRON) tablet 16 mg (has no administration in time range)  ipratropium-albuterol (DUONEB) 0.5-2.5 (3) MG/3ML nebulizer solution 3 mL (3 mLs Nebulization Given 09/26/17 1155)  albuterol (PROVENTIL) (2.5 MG/3ML) 0.083% nebulizer  solution 2.5 mg (2.5 mg Nebulization Given 09/26/17 1155)     Initial Impression / Assessment and Plan / ED Course  I have reviewed the triage vital signs and the nursing notes.  Pertinent labs & imaging results that were available during my care of the patient were reviewed by me and considered in my medical decision making (see chart for details).     Patient had wheezing according to respiratory therapist prior to getting a breathing treatment before I saw him.  He is now feeling well.  He has good air movement with no wheezing.  His vital signs are unremarkable with no  hypoxia or current increased work of breathing.  I doubt pneumonia or other acute bacterial illness.  He will be given a dose of oral Decadron and will be given a prescription for inhaler.  He appears stable for discharge home and we discussed return precautions.  Final Clinical Impressions(s) / ED Diagnoses   Final diagnoses:  Mild intermittent asthma with exacerbation    ED Discharge Orders        Ordered    albuterol (PROVENTIL HFA;VENTOLIN HFA) 108 (90 Base) MCG/ACT inhaler  Every 4 hours PRN     09/26/17 1214       Pricilla Loveless, MD 09/26/17 1216

## 2017-09-26 NOTE — Discharge Instructions (Signed)
Use your albuterol inhaler 1-2 puffs every 4-6 hours as needed for shortness of breath.  If you are needing it more than this or he develop fevers, chest pain, or other new/concerning symptoms then return to the ER for evaluation.

## 2017-09-26 NOTE — ED Notes (Signed)
Ran out of HFA last week.

## 2017-09-26 NOTE — ED Triage Notes (Signed)
C/o "asthma"/wheezing-started yesterday-no inhaler at home-NAD-steady

## 2023-04-11 ENCOUNTER — Emergency Department (HOSPITAL_BASED_OUTPATIENT_CLINIC_OR_DEPARTMENT_OTHER)
Admission: EM | Admit: 2023-04-11 | Discharge: 2023-04-11 | Disposition: A | Discharge status: Home / Self Care | Payer: BC Managed Care – PPO | Attending: Emergency Medicine | Admitting: Emergency Medicine

## 2023-04-11 ENCOUNTER — Encounter (HOSPITAL_BASED_OUTPATIENT_CLINIC_OR_DEPARTMENT_OTHER): Payer: Self-pay

## 2023-04-11 ENCOUNTER — Other Ambulatory Visit: Payer: Self-pay

## 2023-04-11 ENCOUNTER — Emergency Department (HOSPITAL_BASED_OUTPATIENT_CLINIC_OR_DEPARTMENT_OTHER): Payer: BC Managed Care – PPO

## 2023-04-11 DIAGNOSIS — F17210 Nicotine dependence, cigarettes, uncomplicated: Secondary | ICD-10-CM | POA: Insufficient documentation

## 2023-04-11 DIAGNOSIS — J45909 Unspecified asthma, uncomplicated: Secondary | ICD-10-CM | POA: Insufficient documentation

## 2023-04-11 DIAGNOSIS — J189 Pneumonia, unspecified organism: Secondary | ICD-10-CM | POA: Insufficient documentation

## 2023-04-11 DIAGNOSIS — R509 Fever, unspecified: Secondary | ICD-10-CM | POA: Diagnosis present

## 2023-04-11 DIAGNOSIS — Z20822 Contact with and (suspected) exposure to covid-19: Secondary | ICD-10-CM | POA: Insufficient documentation

## 2023-04-11 LAB — RESP PANEL BY RT-PCR (RSV, FLU A&B, COVID)  RVPGX2
Influenza A by PCR: NEGATIVE
Influenza B by PCR: NEGATIVE
Resp Syncytial Virus by PCR: NEGATIVE
SARS Coronavirus 2 by RT PCR: NEGATIVE

## 2023-04-11 MED ORDER — NAPROXEN 250 MG PO TABS
500.0000 mg | ORAL_TABLET | Freq: Once | ORAL | Status: AC
  Administered 2023-04-11: 500 mg via ORAL
  Filled 2023-04-11: qty 2

## 2023-04-11 MED ORDER — NAPROXEN 500 MG PO TABS: 500.0000 mg | ORAL_TABLET | Freq: Two times a day (BID) | ORAL | 0 refills | Status: AC

## 2023-04-11 MED ORDER — DOXYCYCLINE HYCLATE 100 MG PO CAPS: 100.0000 mg | ORAL_CAPSULE | Freq: Two times a day (BID) | ORAL | 0 refills | Status: AC

## 2023-04-11 MED ORDER — DOXYCYCLINE HYCLATE 100 MG PO TABS
100.0000 mg | ORAL_TABLET | Freq: Once | ORAL | Status: AC
  Administered 2023-04-11: 100 mg via ORAL
  Filled 2023-04-11: qty 1

## 2023-04-11 NOTE — ED Triage Notes (Signed)
Pt reports fevers, SHOB, intermittent sharp pain in L side ribs, cough, N/V, and head feels "fuzzy" for one week. Denies headache or blurry vision. No relief from tylenol or ibuprofen, last dose of both at midnight

## 2023-04-11 NOTE — Discharge Instructions (Signed)
You were evaluated in the Emergency Department and after careful evaluation, we did not find any emergent condition requiring admission or further testing in the hospital.  Your exam/testing today is overall reassuring.  Symptoms may be due to pneumonia.  Take the doxycycline antibiotic as directed.  Use the Naprosyn twice daily for pain as needed.  Please return to the Emergency Department if you experience any worsening of your condition.   Thank you for allowing [place] to be a part of your care.

## 2023-04-11 NOTE — ED Provider Notes (Signed)
[facility]EM[facility]Y DEPT [facility] Emergency Department Provider Note MRN:  [id number]  Arrival date & time: 04/11/23     Chief Complaint   Fever   History of Present Illness   Ryan Armstrong is a 27 y.o. year-old male with a history of asthma presenting to the ED with chief complaint of fever.  Feeling unwell for 1 week.  Intermittent fever, intermittent chest discomfort described as soreness in the ribs.  Intermittent cough, nausea vomiting, feeling fuzzy, malaise.  Review of Systems  A thorough review of systems was obtained and all systems are negative except as noted in the HPI and PMH.   Patient's Health History    Past Medical History:  Diagnosis Date   Asthma     History reviewed. No pertinent surgical history.  History reviewed. No pertinent family history.  Social History   Socioeconomic History   Marital status: Single    Spouse name: Not on file   Number of children: Not on file   Years of education: Not on file   Highest education level: Not on file  Occupational History   Not on file  Tobacco Use   Smoking status: Every Day    Current packs/day: 0.50    Types: Cigarettes   Smokeless tobacco: Never  Substance and Sexual Activity   Alcohol use: Yes    Comment: occ   Drug use: No    Types: Marijuana   Sexual activity: Not on file  Other Topics Concern   Not on file  Social History Narrative   Not on file   Social Determinants of Health   Financial Resource Strain: Not on file  Food Insecurity: Not on file  Transportation Needs: Not on file  Physical Activity: Not on file  Stress: Not on file  Social Connections: Unknown (09/27/2021)   Received from [facility]   Social Network    Social Network: Not on file  Intimate Partner Violence: Unknown (08/18/2021)   Received from [facility]   HITS    Physically Hurt: Not on file    Insult or Talk Down To: Not on file    Threaten Physical Harm: Not on file    Scream or Curse: Not on file      Physical Exam   Vitals:   04/11/23 0518 04/11/23 0600  BP: (!) 148/91 (!) 140/81  Pulse: (!) 107 91  Resp: 20 (!) 28  Temp: 98.9 F (37.2 C)   SpO2: 97% 96%    CONSTITUTIONAL: Well-appearing, NAD NEURO/PSYCH:  Alert and oriented x 3, no focal deficits, no meningismus EYES:  eyes equal and reactive ENT/NECK:  no LAD, no JVD CARDIO: Tachycardic rate, well-perfused, normal S1 and S2 PULM:  CTAB no wheezing or rhonchi GI/GU:  non-distended, non-tender MSK/SPINE:  No gross deformities, no edema SKIN:  no rash, atraumatic   *Additional and/or pertinent findings included in MDM below  Diagnostic and Interventional Summary    EKG Interpretation Date/Time:  Wednesday April 11 2023 05:35:28 EST Ventricular Rate:  97 PR Interval:  161 QRS Duration:  119 QT Interval:  334 QTC Calculation: 425 R Axis:   -15  Text Interpretation: Sinus rhythm Probable left atrial enlargement Incomplete right bundle branch block ST elevation suggests acute pericarditis Confirmed by Kennis Carina [phone]) on 04/11/2023 6:13:02 AM       Labs Reviewed  RESP PANEL BY RT-PCR (RSV, FLU A&B, COVID)  [id number]    DG Chest [facility] 1 View  Final Result  Medications  doxycycline (VIBRA-TABS) tablet 100 mg (has no administration in time range)  naproxen (NAPROSYN) tablet 500 mg (500 mg Oral Given 04/11/23 0542)     Procedures  /  Critical Care Procedures  ED Course and Medical Decision Making  Initial Impression and Ddx Favoring viral illness, other considerations include pneumonia.  No evidence of DVT, no hypoxia, no increased work of breathing, doubt PE.  Past medical/surgical history that increases complexity of ED encounter: None  Interpretation of Diagnostics I personally reviewed the EKG and my interpretation is as follows: Sinus rhythm, nonspecific findings, no prior for comparison  Chest x-ray with possible evidence of pneumonia  Patient Reassessment and Ultimate  Disposition/Management     Patient resting comfortably on reassessment in no acute distress, appropriate for discharge with antibiotics.  Patient management required discussion with the following services or consulting groups:  None  Complexity of Problems Addressed Acute illness or injury that poses threat of life of bodily function  Additional Data Reviewed and Analyzed Further history obtained from: None  Additional Factors Impacting ED Encounter Risk Prescriptions  Elmer Sow. Pilar Plate, MD [facility] Emergency Medicine [facility] [email or web address]  Final Clinical Impressions(s) / ED Diagnoses     ICD-10-CM   1. Community acquired pneumonia, unspecified laterality  J18.9       ED Discharge Orders          Ordered    doxycycline (VIBRAMYCIN) 100 MG capsule  2 times daily        04/11/23 0615    naproxen (NAPROSYN) 500 MG tablet  2 times daily        04/11/23 0615             Discharge Instructions Discussed with and Provided to Patient:     Discharge Instructions      You were evaluated in the Emergency Department and after careful evaluation, we did not find any emergent condition requiring admission or further testing in the hospital.  Your exam/testing today is overall reassuring.  Symptoms may be due to pneumonia.  Take the doxycycline antibiotic as directed.  Use the Naprosyn twice daily for pain as needed.  Please return to the Emergency Department if you experience any worsening of your condition.   Thank you for allowing [place] to be a part of your care.       Sabas Sous, MD 04/11/23 [id number]
